# Patient Record
Sex: Female | Born: 1995 | Race: White | Hispanic: No | Marital: Single | Smoking: Never smoker
Health system: Southern US, Community
[De-identification: ages and names within clinical notes are randomized; demographics above are authoritative.]

---
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
## 2016-04-02 ENCOUNTER — Ambulatory Visit (INDEPENDENT_AMBULATORY_CARE_PROVIDER_SITE_OTHER): Payer: Managed Care, Other (non HMO) | Admitting: Family Medicine

## 2016-04-02 DIAGNOSIS — M79672 Pain in left foot: Secondary | ICD-10-CM

## 2016-04-06 NOTE — Progress Notes (Signed)
Patient presents today regarding history of left foot pain. Patient states that she started noticing the foot pain about a week ago. She denies any 1 incident or trauma to the foot. Patient does have a history of a navicular fracture which she had surgery for last year. She does have screws in her foot. Patient states that she is really not had any discomfort until now since starting the lacrosse season a few weeks ago. She has made an appointment with the foot and ankle surgeon for next week. She denies any other symptoms. She denies any changes in activity level recently. She does state that she has custom orthotics however cannot put them in her cleats because they do not fit. She denies any problems with her menstrual cycles and denies any vitamin D deficiency issues.  ROS: Negative except mentioned above.  Vitals as per Epic.  GENERAL: NAD RESP: CTA B CARD: RRR MSK: Left Foot - no ecchymosis or swelling appreciated, tenderness along navicular, FROM, +hop test, nv intact  NEURO: CN II-XII grossly intact   A/P: Left Foot Pain w/hx of Navicular Fx/Surgery - since patient is seeing foot and ankle surgeon next week will hold off on doing any imaging. Imaging will likely be ordered by the foot and ankle surgeon. I would recommend at this point the patient not run and only cross-train until seeing the foot and ankle surgeon. If any pain with cross-training, patient should stop activity. She is currently not having pain with just walking so I will not make her nonweightbearing.

## 2016-04-12 ENCOUNTER — Other Ambulatory Visit: Payer: Self-pay | Admitting: Orthopaedic Surgery

## 2016-04-12 DIAGNOSIS — M7742 Metatarsalgia, left foot: Secondary | ICD-10-CM

## 2016-04-12 DIAGNOSIS — S92255A Nondisplaced fracture of navicular [scaphoid] of left foot, initial encounter for closed fracture: Secondary | ICD-10-CM

## 2016-04-12 DIAGNOSIS — S92255D Nondisplaced fracture of navicular [scaphoid] of left foot, subsequent encounter for fracture with routine healing: Secondary | ICD-10-CM

## 2016-04-22 ENCOUNTER — Ambulatory Visit: Payer: Managed Care, Other (non HMO)

## 2016-04-22 ENCOUNTER — Ambulatory Visit
Admission: RE | Admit: 2016-04-22 | Discharge: 2016-04-22 | Disposition: A | Discharge status: Home / Self Care | Payer: Managed Care, Other (non HMO) | Source: Ambulatory Visit | Attending: Orthopaedic Surgery | Admitting: Orthopaedic Surgery

## 2016-04-22 DIAGNOSIS — Z8781 Personal history of (healed) traumatic fracture: Secondary | ICD-10-CM | POA: Insufficient documentation

## 2016-04-22 DIAGNOSIS — S92255D Nondisplaced fracture of navicular [scaphoid] of left foot, subsequent encounter for fracture with routine healing: Secondary | ICD-10-CM

## 2016-04-22 DIAGNOSIS — M7742 Metatarsalgia, left foot: Secondary | ICD-10-CM | POA: Insufficient documentation

## 2016-04-22 DIAGNOSIS — Z9889 Other specified postprocedural states: Secondary | ICD-10-CM | POA: Diagnosis not present

## 2016-04-22 DIAGNOSIS — S92255A Nondisplaced fracture of navicular [scaphoid] of left foot, initial encounter for closed fracture: Secondary | ICD-10-CM

## 2016-05-13 ENCOUNTER — Other Ambulatory Visit: Payer: Managed Care, Other (non HMO)

## 2016-05-13 ENCOUNTER — Ambulatory Visit: Payer: Managed Care, Other (non HMO)

## 2016-08-30 ENCOUNTER — Encounter: Payer: Self-pay | Admitting: Family Medicine

## 2016-08-30 ENCOUNTER — Ambulatory Visit (INDEPENDENT_AMBULATORY_CARE_PROVIDER_SITE_OTHER): Payer: Managed Care, Other (non HMO) | Admitting: Family Medicine

## 2016-08-30 VITALS — BP 106/56 | HR 80 | Temp 97.9°F | Resp 14

## 2016-08-30 DIAGNOSIS — R509 Fever, unspecified: Secondary | ICD-10-CM

## 2016-08-30 DIAGNOSIS — J101 Influenza due to other identified influenza virus with other respiratory manifestations: Secondary | ICD-10-CM

## 2016-08-30 LAB — POCT INFLUENZA A/B
Influenza A, POC: POSITIVE — AB
Influenza B, POC: NEGATIVE

## 2016-08-30 MED ORDER — OSELTAMIVIR PHOSPHATE 75 MG PO CAPS: 75.0000 mg | ORAL_CAPSULE | Freq: Two times a day (BID) | ORAL | 0 refills | Status: AC

## 2016-08-30 NOTE — Progress Notes (Signed)
Patient presents with a 2 day history of nasal congestion, cough, night sweats, sore throat, subjective fever. Patient denies any chest pain, shortness of breath, severe headache, nausea, vomiting, diarrhea, abdominal pain. She has been taking Ibuprofen for her symptoms.  ROS: Negative except mentioned above. Vitals as per Epic. GENERAL: NAD HEENT: mild pharyngeal erythema, no exudate, no erythema of TMs, no cervical LAD RESP: CTA B CARD: RRR NEURO: CN II-XII grossly intact   A/P: Influenza A- patient fits timeframe of Tamiflu, Ibuprofen/Tylenol when necessary, Claritin when necessary, Delsym when necessary, rest, hydration, no athletic activity or class until afebrile for at least 24 hours without taking fever lowering medication, seek medical attention if symptoms persist or worsen as discussed.

## 2016-10-07 ENCOUNTER — Encounter: Payer: Self-pay | Admitting: Family Medicine

## 2016-10-07 ENCOUNTER — Ambulatory Visit (INDEPENDENT_AMBULATORY_CARE_PROVIDER_SITE_OTHER): Payer: Managed Care, Other (non HMO) | Admitting: Family Medicine

## 2016-10-07 VITALS — BP 114/57 | HR 77 | Temp 97.6°F | Resp 14

## 2016-10-07 DIAGNOSIS — R350 Frequency of micturition: Secondary | ICD-10-CM

## 2016-10-07 DIAGNOSIS — N390 Urinary tract infection, site not specified: Secondary | ICD-10-CM

## 2016-10-07 LAB — POCT URINE PREGNANCY: Preg Test, Ur: NEGATIVE

## 2016-10-07 MED ORDER — NITROFURANTOIN MONOHYD MACRO 100 MG PO CAPS: 100.0000 mg | ORAL_CAPSULE | Freq: Two times a day (BID) | ORAL | 0 refills | Status: AC

## 2016-10-07 NOTE — Progress Notes (Signed)
Patient presents today for symptoms of urinary frequency and dysuria. Patient states that she's had symptoms for the last 3-4 days. She noticed a small amount of blood yesterday. She denies any vaginal bleeding or vaginal discharge. Her menstrual cycles have been normal. She is sexually active but not in the last month. She denies any nausea, vomiting, headaches, fever, chills. Denies ever having a UTI in the past. Patient is having procedure next Tuesday to replace screw she has for a navicular fracture.  ROS: Negative except mentioned above. Vitals as per Epic. GENERAL: NAD HEENT: no pharyngeal erythema, no exudate RESP: CTA B CARD: RRR ABD: Positive bowel sounds, nontender, no rebound or guarding appreciated, no flank tenderness NEURO: CN II-XII grossly intact   Urine dip: 2+ leukocytes, negative nitrates, 1+ protein, pH 6.5, 3+ blood, specific gravity 1.015, trace ketones, negative glucose. Urine HCG negative  A/P: UTI - will send urine for culture, Macrobid twice a day for 7 days, rest, hydration, seek medical attention if symptoms persist or worsen as discussed. Patient should use protection if having sex. Patient should have no problems proceeding with surgery next week as long as her symptoms resolve. Did inform patient to inform surgeon about recent UTI and treatment.

## 2016-10-09 LAB — URINE CULTURE

## 2016-11-01 ENCOUNTER — Ambulatory Visit (INDEPENDENT_AMBULATORY_CARE_PROVIDER_SITE_OTHER): Payer: Managed Care, Other (non HMO) | Admitting: Family Medicine

## 2016-11-01 DIAGNOSIS — Z4889 Encounter for other specified surgical aftercare: Secondary | ICD-10-CM

## 2016-11-01 NOTE — Progress Notes (Signed)
Note to be scanned in.  

## 2017-10-17 ENCOUNTER — Ambulatory Visit (INDEPENDENT_AMBULATORY_CARE_PROVIDER_SITE_OTHER): Payer: Managed Care, Other (non HMO) | Admitting: Family Medicine

## 2017-10-17 ENCOUNTER — Encounter: Payer: Self-pay | Admitting: Family Medicine

## 2017-10-17 VITALS — BP 116/56 | HR 68 | Temp 97.6°F | Resp 14

## 2017-10-17 DIAGNOSIS — H9202 Otalgia, left ear: Secondary | ICD-10-CM

## 2017-10-17 NOTE — Progress Notes (Signed)
She presents today with symptoms of left ear pressure for the last 4 days. She feels like her ear is clogged. Patient states that she had a history of multiple ear infections as a child and had tubes twice. She denies any drainage from the ear. She denies any fever. She does have some nasal congestion and cough. She states that it was uncomfortable on a plane a few days ago. She denies any colored nasal congestion or productive cough.  ROS: Negative except mentioned above. Vitals as per Epic. GENERAL: NAD HEENT: no pharyngeal erythema, no exudate, no erythema of TMs, no cerumen appreciated, no discharge from the ear, positive scarring from previous ear infections bilaterally, no cervical LAD RESP: CTA B CARD: RRR NEURO: CN II-XII grossly intact   A/P: Nasal congestion, Otalgia -there does not appear to be any signs of an ear infection, discussed with patient to try antihistamine-decongestant for 5-7 days and Ibuprofen. Avoid water in the ear. If symptoms do persist or worsen she will seek medical attention.

## 2017-11-05 IMAGING — CT CT FOOT*L* W/O CM
2 of 3 series · 15 of 30 positions shown, 18 images · non-contrast
Comparison: MRI of the left ankle 04/03/2015. CT scan left foot
04/08/2015. Plain films left foot 03/27/2015.

CLINICAL DATA: History of left ankle injury in 0128 and navicular
stress fracture. Subsequent encounter.

EXAM:
CT OF THE LEFT FOOT WITHOUT CONTRAST
TECHNIQUE: Multidetector CT imaging of the left foot was performed according to
the standard protocol. Multiplanar CT image reconstructions were
also generated.

[Series 3: foot · axial · 0.36mm/px · z∈[-170,-12]mm · 9 of 125 slices shown, 12 images]
[im 10/125  soft-tissue]
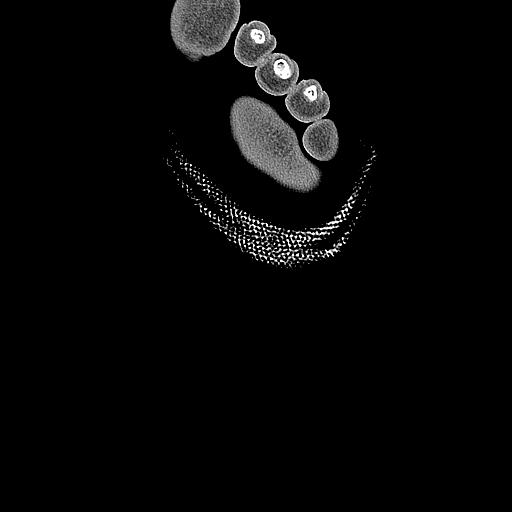
[im 10/125  bone]
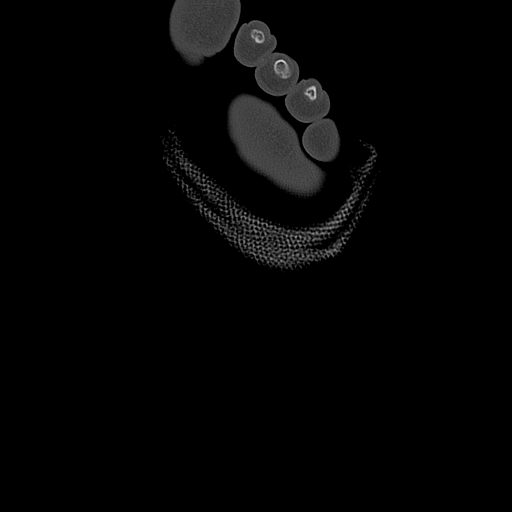
[im 29/125  bone]
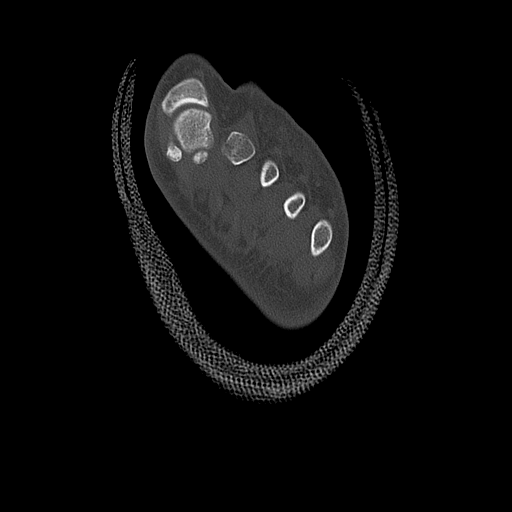
[im 39/125  bone]
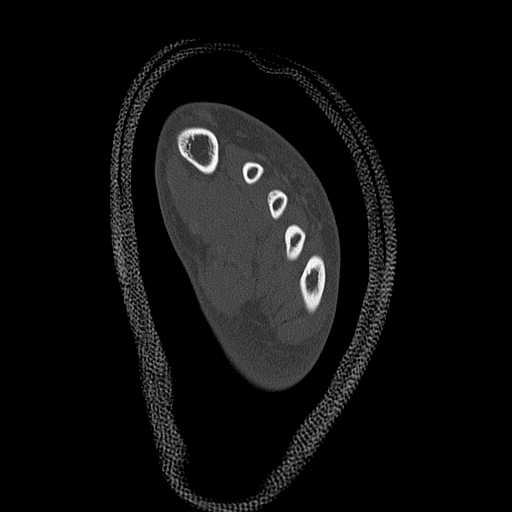
[im 48/125  bone]
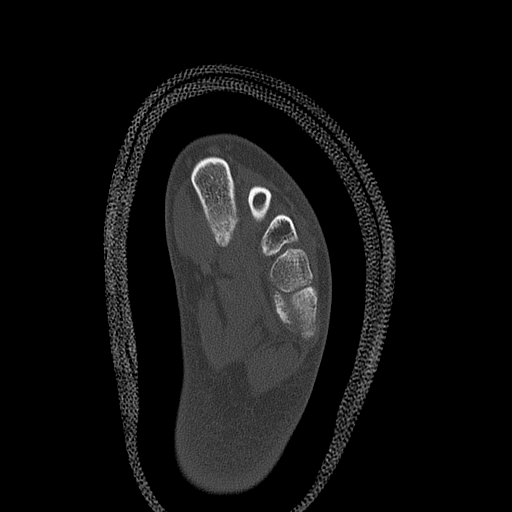
[im 67/125  soft-tissue]
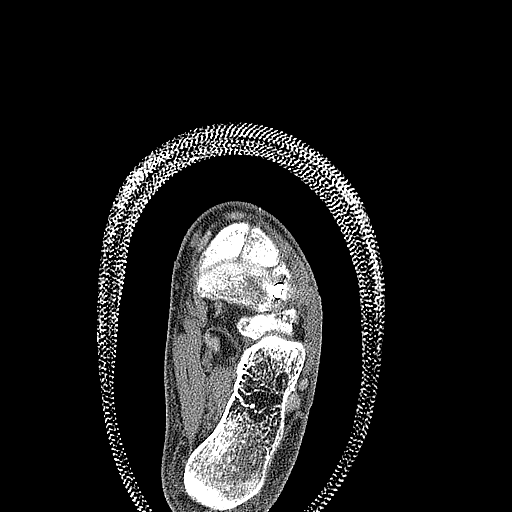
[im 67/125  bone]
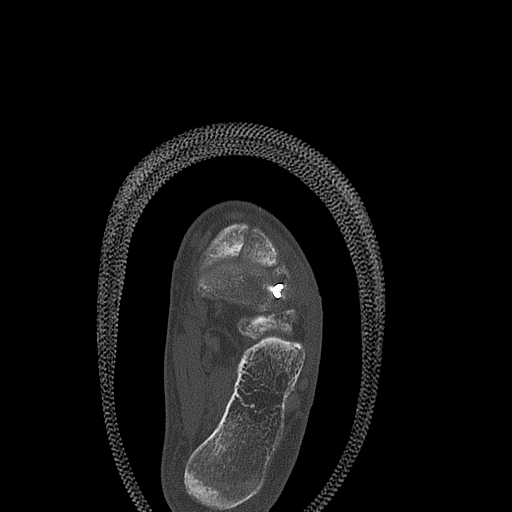
[im 77/125  bone]
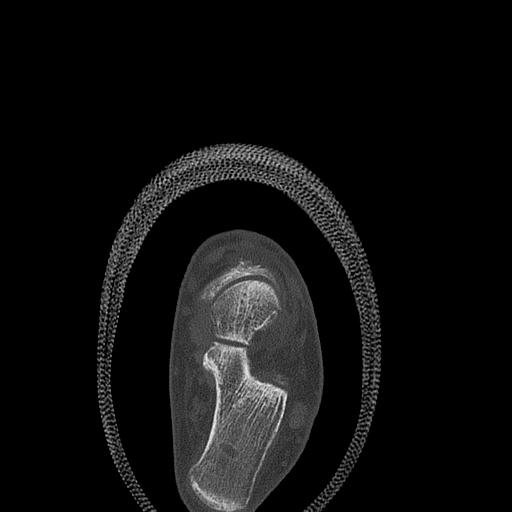
[im 86/125  bone]
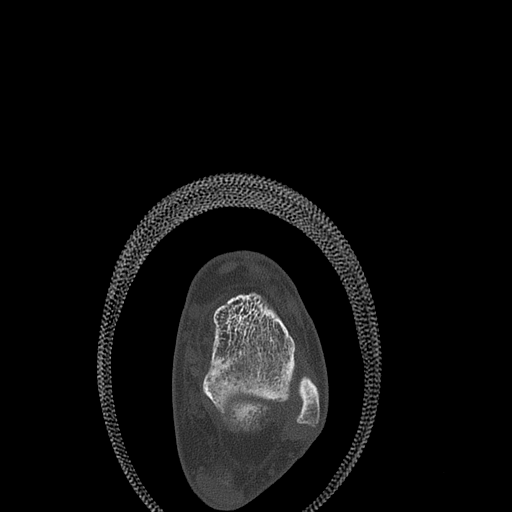
[im 105/125  bone]
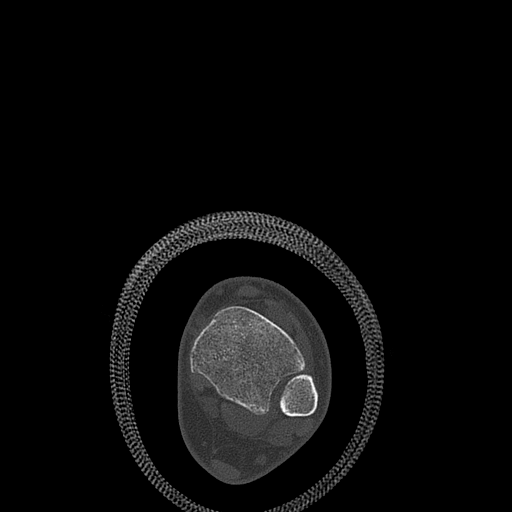
[im 115/125  soft-tissue]
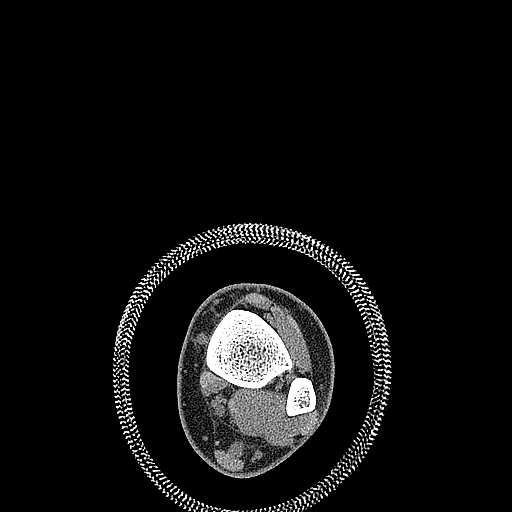
[im 115/125  bone]
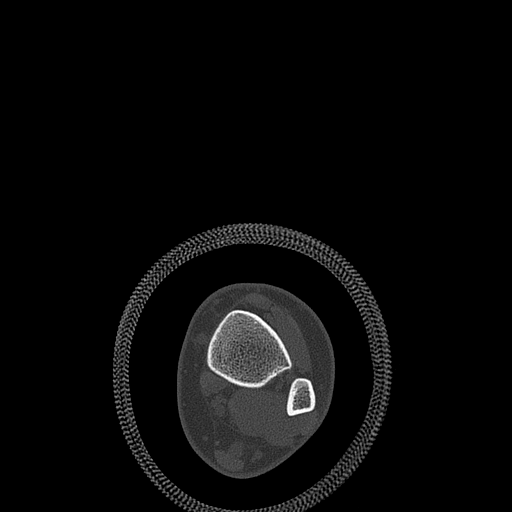

[Series 10: sag bone · sagittal · 0.42mm/px · 6 of 48 slices shown]
[im 8/48  bone]
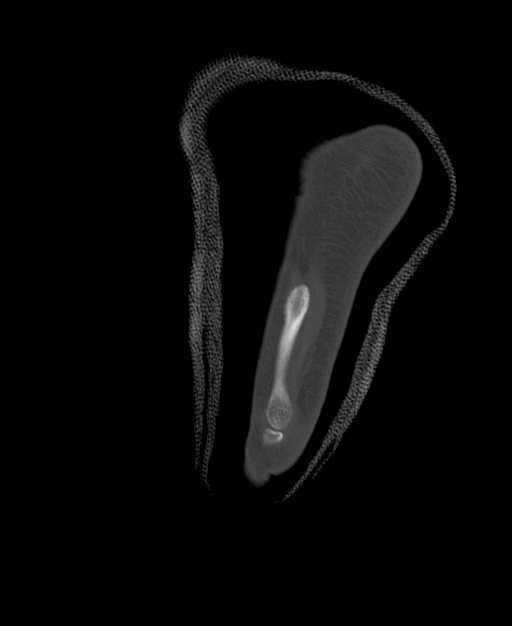
[im 16/48  bone]
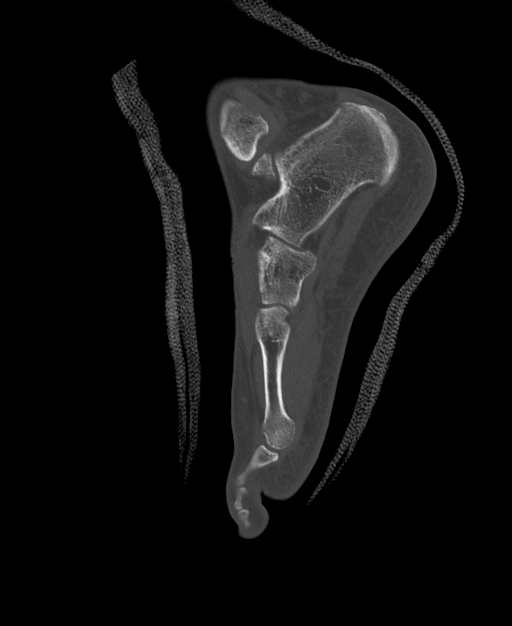
[im 18/48  soft-tissue]
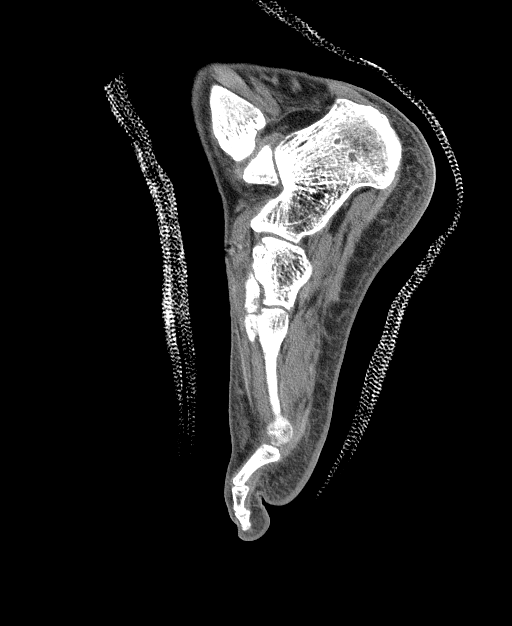
[im 24/48  bone]
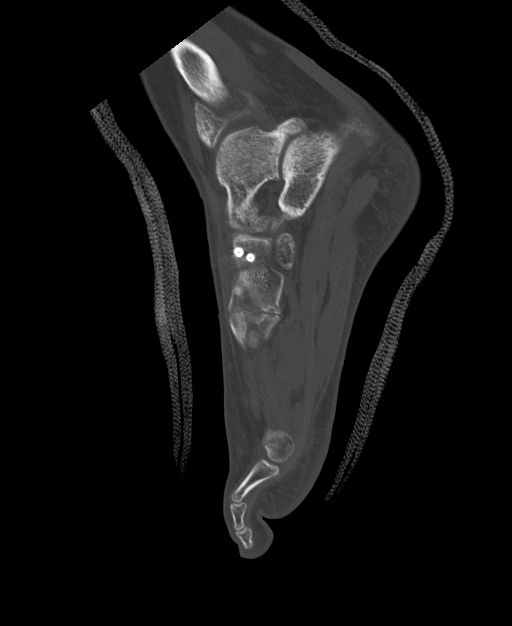
[im 32/48  bone]
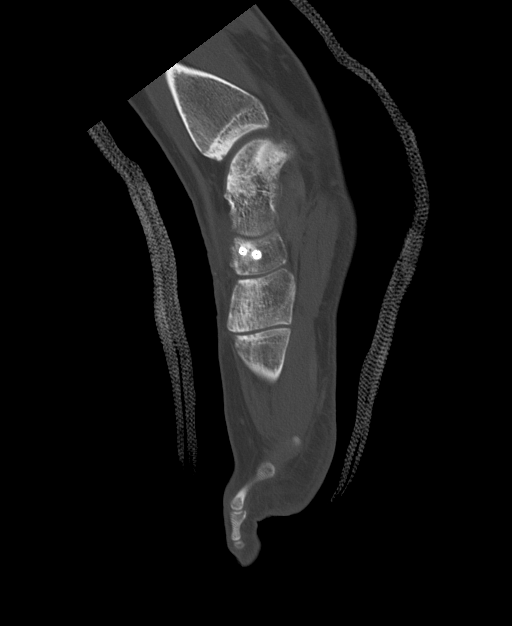
[im 40/48  bone]
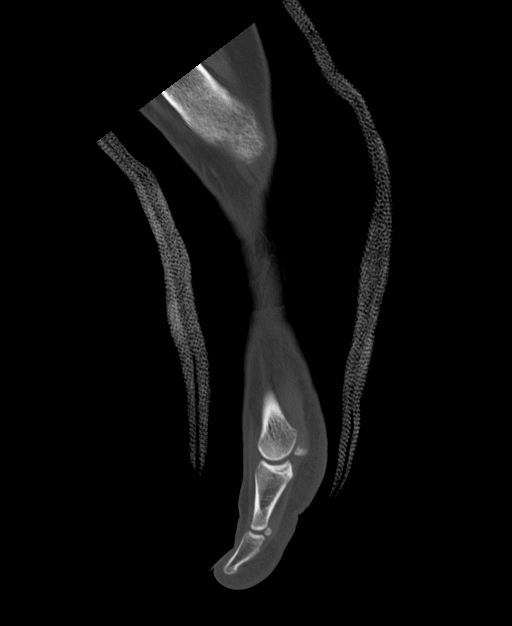

[15 of 30 positions shown; findings below may reference images not displayed]

FINDINGS: The patient is in a fiberglass cast. Since the most recent
examination, 2 screws have been placed in the navicular bone for
fracture fixation. The screws are intact without lucency or other
complicating feature. Two screw tracks in the calcaneus are new
since the prior exams. All bones are osteopenic most consistent with
disuse. Sclerosis in the proximal and medial navicular seen on the
prior examination is less conspicuous. The fracture line of the
patient's incomplete fracture of the navicular is no longer seen. No
new fracture is identified. There is no focal bony lesion. No
evidence of arthropathy is identified. There is no tarsal coalition.
Soft tissues are unremarkable.
IMPRESSION: Status post placement of 2 fixation screws in the navicular for a
nondisplaced and incomplete fracture. The fracture demonstrates
progressive healing and sclerosis in the navicular about the
fracture has resolved.

Diffuse osteopenia most consistent with disuse.
